# Patient Record
Sex: Male | Born: 1960 | Race: Black or African American | Hispanic: No | Marital: Married | Smoking: Never smoker
Health system: Southern US, Community
[De-identification: ages and names within clinical notes are randomized; demographics above are authoritative.]

---

## 1999-09-17 ENCOUNTER — Emergency Department (HOSPITAL_COMMUNITY): Admission: EM | Admit: 1999-09-17 | Discharge: 1999-09-17 | Payer: Self-pay | Admitting: Unknown Physician Specialty

## 2003-07-22 ENCOUNTER — Emergency Department (HOSPITAL_COMMUNITY): Admission: EM | Admit: 2003-07-22 | Discharge: 2003-07-22 | Payer: Self-pay | Admitting: Emergency Medicine

## 2008-11-18 ENCOUNTER — Encounter: Admission: RE | Admit: 2008-11-18 | Discharge: 2008-11-18 | Payer: Self-pay | Admitting: Family Medicine

## 2011-11-04 ENCOUNTER — Ambulatory Visit (INDEPENDENT_AMBULATORY_CARE_PROVIDER_SITE_OTHER): Payer: BC Managed Care – PPO | Admitting: Physician Assistant

## 2011-11-04 VITALS — BP 140/82 | HR 81 | Temp 98.6°F | Resp 18 | Ht 66.0 in | Wt 172.0 lb

## 2011-11-04 DIAGNOSIS — Z0000 Encounter for general adult medical examination without abnormal findings: Secondary | ICD-10-CM

## 2011-11-04 LAB — POCT URINALYSIS DIPSTICK
Blood, UA: NEGATIVE
Spec Grav, UA: 1.01

## 2011-11-04 NOTE — Progress Notes (Signed)
   [address], [place] [address]   Phone [phone]  Subjective:    Patient ID: Eric Norman, male    DOB: 05/13/1960, 51 y.o.   MRN: [id number]  HPI Pt presents to clinic for CPE/DOT exam.  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  He does not want labs.  He is healthy with no concerns.  He has never had BP problems before.   Review of Systems  Constitutional: Negative.   HENT: Negative.   Eyes: Negative.   Respiratory: Negative.   Cardiovascular: Negative.   Gastrointestinal: Negative.   Genitourinary: Negative.   Musculoskeletal: Negative.   Skin: Negative.   Neurological: Negative.   Hematological: Negative.   Psychiatric/Behavioral: Negative.        Objective:   Physical Exam  Vitals reviewed. Constitutional: He is oriented to person, place, and time. He appears well-developed and well-nourished.  HENT:  Head: Normocephalic and atraumatic.  Right Ear: Hearing, tympanic membrane, external ear and ear canal normal.  Left Ear: Hearing, tympanic membrane, external ear and ear canal normal.  Nose: Nose normal.  Mouth/Throat: Uvula is midline, oropharynx is clear and moist and mucous membranes are normal.  Eyes: Conjunctivae normal, EOM and lids are normal. Pupils are equal, round, and reactive to light.  Neck: Normal range of motion. Neck supple.  Cardiovascular: Normal rate, regular rhythm and normal heart sounds.  Exam reveals no gallop and no friction rub.   No murmur heard. Pulmonary/Chest: Effort normal and breath sounds normal.  Abdominal: Soft. Bowel sounds are normal. Hernia confirmed negative in the right inguinal area and confirmed negative in the left inguinal area.  Genitourinary: Penis normal.       Declined rectal exam.  Musculoskeletal: Normal range of motion.  Lymphadenopathy:    He has no cervical adenopathy.  Neurological: He is alert and oriented to person, place, and time.  Skin: Skin is warm and dry.  Psychiatric: He has a  normal mood and affect. His behavior is normal. Judgment and thought content normal.    Results for orders placed in visit on 11/04/11  POCT URINALYSIS DIPSTICK      Component Value Range   Color, UA yellow     Clarity, UA       Glucose, UA neg     Bilirubin, UA       Ketones, UA       Spec Grav, UA 1.010     Blood, UA neg     pH, UA       Protein, UA neg     Urobilinogen, UA       Nitrite, UA       Leukocytes, UA             Assessment & Plan:   1. Annual physical exam  POCT urinalysis dipstick   DOT paperwork filled out.  See media section for copy.  Pt will be RTC for me to fill in his BP on his physical form because I did not do it while he was here before he left.

## 2012-08-10 ENCOUNTER — Ambulatory Visit (INDEPENDENT_AMBULATORY_CARE_PROVIDER_SITE_OTHER): Payer: 59 | Admitting: Internal Medicine

## 2012-08-10 ENCOUNTER — Ambulatory Visit: Payer: 59

## 2012-08-10 VITALS — BP 112/74 | HR 73 | Temp 97.8°F | Resp 18 | Ht 65.0 in | Wt 175.0 lb

## 2012-08-10 DIAGNOSIS — M25521 Pain in right elbow: Secondary | ICD-10-CM

## 2012-08-10 DIAGNOSIS — M25529 Pain in unspecified elbow: Secondary | ICD-10-CM

## 2012-08-10 DIAGNOSIS — S46311A Strain of muscle, fascia and tendon of triceps, right arm, initial encounter: Secondary | ICD-10-CM

## 2012-08-10 DIAGNOSIS — S43499A Other sprain of unspecified shoulder joint, initial encounter: Secondary | ICD-10-CM

## 2012-08-10 NOTE — Progress Notes (Signed)
  Subjective:    Patient ID: Eric Norman, male    DOB: 1960/01/22, 52 y.o.   MRN: [id number]  HPI At home injured right elbow lifting and pulling. Sudden injury , has not improved in 3 weeks. Has weak tricep extention and pain distal posterior humerus.   Review of Systems     Objective:   Physical Exam  Vitals reviewed. Constitutional: He is oriented to person, place, and time. He appears well-developed and well-nourished. No distress.  HENT:  Nose: Nose normal.  Eyes: EOM are normal.  Cardiovascular: Normal rate.   Musculoskeletal: He exhibits edema and tenderness.       Right elbow: He exhibits decreased range of motion, swelling and deformity. He exhibits no laceration. Tenderness found. Olecranon process tenderness noted. No radial head, no medial epicondyle and no lateral epicondyle tenderness noted.  Neurological: He is alert and oriented to person, place, and time. He displays no atrophy. No sensory deficit. He exhibits abnormal muscle tone. Coordination normal.  Skin: No rash noted.  Psychiatric: He has a normal mood and affect.    [facility] reading (PRIMARY) by  Dr Raelene Bott fx seen        Assessment & Plan:  Right elbow strain/Possible torn tricep 91 weeks old Refer to ortho/[facility]

## 2012-08-10 NOTE — Patient Instructions (Addendum)
Triceps Tendon Rupture with Rehab The triceps muscle is located on the backside of the upper arm and is responsible for straightening the elbow and extending the upper arm backwards. A triceps tendon rupture is a complete tear of the tendon that attaches the triceps muscle to the ulna (one of the forearm bones). A triceps tendon rupture results in decreased triceps function. SYMPTOMS   Pain, tenderness, inflammation and/or bruising over the injury (contusion).  "Popping" or tearing sensation felt and/or heard in the elbow at the time of injury.  Decreased ability to straighten the elbow or extend the shoulder.  A crackling sound (crepitation) when the tendon is moved or touched.  Loss of firm fullness when pushing on the area where the tendon ruptured. CAUSES  Triceps tendon ruptures occur when a force is placed on the tendon that is greater than it can withstand. Common mechanisms of injury include:  Stress on the tendon from a sudden increase in intensity, frequency or duration of training.  Direct trauma to the tendon.  A cut (laceration) of the tendon. RISK INCREASES WITH:  Activities that involve repetitive movements and/or straightening of the elbow or extension of the shoulder (weightlifting or push-ups).  Poor strength and flexibility.  The use of steroids.  Previous use of corticosteroid injections.  Incomplete treatment of triceps tendinitis.  Previous triceps tendon injury. PREVENTION  Warm up and stretch properly before activity.  Allow for adequate recovery between workouts.  Maintain physical fitness:  Strength, flexibility, and endurance.  Cardiovascular fitness.  Learn and use proper technique, especially regarding training program design. When possible, have coach correct improper technique. PROGNOSIS  If treated properly, the symptoms of a ruptured triceps tendon usually resolve within 6 to 9 months, after which return to sports is allowed. RELATED  COMPLICATIONS   Permanent elbow weakness.  Re-rupture of the tendon after treatment.  Prolonged disability.  Risks of surgery: infection, bleeding, nerve damage or damage to surrounding tissues. TREATMENT Treatment initially involves resting from any activities that aggravate the symptoms, and the use of ice and medications to help reduce pain and inflammation. A triceps tendon rupture will not heal if left untreated. Definitive treatment requires surgery to reattach the tendon. After surgery the elbow and shoulder must be immobilized to allow for healing. After immobilization it is important to perform strengthening and stretching exercises to help regain strength and a full range of motion. These exercises may be completed at home or with a therapist. MEDICATION  If pain medication is necessary, then nonsteroidal anti-inflammatory medications, such as aspirin and ibuprofen, or other minor pain relievers, such as acetaminophen, are often recommended.  Do not take pain medication within 7 days before surgery.  Prescription pain relievers may be given if deemed necessary by your caregiver. Use only as directed and only as much as you need. COLD THERAPY  Cold treatment (icing) relieves pain and reduces inflammation. Cold treatment should be applied for 10 to 15 minutes every 2 to 3 hours for inflammation and pain and immediately after any activity that aggravates your symptoms. Use ice packs or massage the area with a piece of ice (ice massage). SEEK MEDICAL CARE IF:  Treatment seems to offer no benefit, or the condition worsens.  Any medications produce adverse side effects.  Any complications from surgery occur including:  Pain, numbness, or coldness in the extremity operated upon.  Discoloration of the nail beds (they become blue or gray) of the extremity operated upon.  Signs of infections (fever, pain, inflammation,  redness, or persistent bleeding). EXERCISES RANGE OF MOTION  (ROM) AND STRETCHING EXERCISES - Triceps Tendon Rupture These exercises may help you when beginning to rehabilitate your injury. Your symptoms may resolve with or without further involvement from your physician, physical therapist or athletic trainer. While completing these exercises, remember:  Restoring tissue flexibility helps normal motion to return to the joints. This allows healthier, less painful movement and activity.  An effective stretch should be held for at least 30 seconds.  A stretch should never be painful. You should only feel a gentle lengthening or release in the stretched tissue. RANGE OF MOTION  Flexion  Hold your right / left arm at your side and bend your elbow as far as you can using your right / left arm muscles.  Bend the right / left elbow farther by gently pushing up on your forearm, until you feel a gentle stretch on the outside of your elbow. Hold this position for __________ seconds.  Slowly return to the starting position. Repeat __________ times. Complete this exercise __________ times per day. RANGE OF MOTION  Elbow Flexion, Supine  Lie on your back. Extend your right / left arm into the air, bracing it with your opposite hand. Allow your right / left arm to relax.  Let your elbow bend, allowing your hand to fall slowly toward your chest.  You should feel a gentle stretch along the back of your upper arm and/or elbow. Your physician, physical therapist or athletic trainer may ask you to hold a __________ hand weight to increase the intensity of this stretch.  Hold for __________ seconds. Slowly return your right / left arm to the upright position. Repeat __________ times. Complete this exercise __________ times per day. STRETCH  Elbow Flexors   Lie on a firm bed or countertop on your back. Be sure that you are in a comfortable position which will allow you to relax your arm muscles.  Place a folded towel under your upper arm so that your elbow and  shoulder are at the same height. Extend your arm; your elbow should not rest on the bed or towel.  Allow the weight of your hand to straighten your elbow. Keep your arm and chest muscles relaxed. Your caretaker may ask you to increase the intensity of your stretch by adding a small wrist or hand weight.  Hold for __________ seconds. You should feel a stretch on the inside of your elbow. Slowly return to the starting position. Repeat __________ times. Complete this exercise __________ times per day. STRENGTHENING EXERCISES - Triceps Tendon Rupture These exercises will help you regain your strength. These exercises may resolve your symptoms with or without further involvement from your physician, physical therapist or athletic trainer. While completing these exercises, remember:  Muscles can gain both the endurance and the strength needed for everyday activities through controlled exercises.  Complete these exercises as instructed by your physician, physical therapist or athletic trainer. Progress with the resistance and repetition exercises only as your caregiver advises.  You may experience muscle soreness or fatigue, but the pain or discomfort you are trying to eliminate should never worsen during these exercises. If this pain does worsen, stop and make certain you are following the directions exactly. If the pain is still present after adjustments, stop the exercise until you can discuss the trouble with your clinician. STRENGTH - Elbow Extensors, Isometric  Stand or sit upright on a firm surface. Place your right / left arm so that your palm faces  your abdomen and it is at the height of your waist.  Place your opposite hand on the underside of your forearm. Gently push up as your right / left arm resists. Push as hard as you can with both arms without causing any pain or movement at your right / left elbow. Hold this stationary position for __________ seconds.  Gradually release the tension in  both arms. Allow your muscles to relax completely before repeating. Repeat __________ times. Complete this exercise __________ times per day. STRENGTH  Elbow Flexors, Supinated  With good posture, stand or sit on a firm chair without armrests. Allow your right / left arm to rest at your side with your palm facing forward.  Holding a __________ weight or gripping a rubber exercise band/tubing, bring your hand toward your shoulder.  Allow your muscles to control the resistance as your hand returns to your side. Repeat __________ times. Complete this exercise __________ times per day.  STRENGTH  Elbow Flexors, Neutral  With good posture, stand or sit on a firm chair without armrests. Allow your right / left arm to rest at your side with your thumb facing forward.  Holding a __________ weight or gripping a rubber exercise band/tubing, bring your hand toward your shoulder.  Allow your muscles to control the resistance as your hand returns to your side. Repeat __________ times. Complete this exercise __________ times per day. STRENGTH  Elbow Extensors  Lie on your back. Extend your right / left elbow into the air, pointing it toward the ceiling. Brace your arm with your opposite hand.*  Holding a __________ weight in your hand, slowly straighten your right / left elbow.  Allow your muscles to control the weight as your hand returns to its starting position. Repeat __________ times. Complete this exercise __________ times per day. *You may also stand with your elbow overhead and pointed toward the ceiling and supported by your opposite hand. STRENGTH - Elbow Extensors, Dynamic  With good posture, stand or sit on a firm chair without armrests. Keeping your upper arms at your side, bring both hands up to your right / left shoulder while gripping a rubber exercise band/tubing. Your right / left hand should be just below the other hand.  Straighten your right / left elbow. Hold for __________  seconds.  Allow your muscles to control the rubber exercise band/tubing as your hand returns to your shoulder. Repeat __________ times. Complete this exercise __________ times per day. Document Released: 12/27/2004 Document Revised: 03/21/2011 Document Reviewed: 04/10/2008 [facility] Patient Information 2014 [place], [place].

## 2012-08-15 ENCOUNTER — Telehealth: Payer: Self-pay

## 2012-08-15 NOTE — Telephone Encounter (Signed)
Error

## 2014-10-14 ENCOUNTER — Telehealth: Payer: Self-pay

## 2014-10-14 NOTE — Telephone Encounter (Signed)
Patient left a voicemail today at 12:22pm stating he no longer needs records since he initially needed them this morning and didn't receive them. Medical records can disregard request.

## 2014-10-14 NOTE — Telephone Encounter (Signed)
Patient left VM requesting a call back regarding his 2014 OV.  No detailed information left on VM.  [phone] Called patient and asked the patient to call back.

## 2016-05-16 DIAGNOSIS — I10 Essential (primary) hypertension: Secondary | ICD-10-CM | POA: Diagnosis not present

## 2016-05-30 DIAGNOSIS — E782 Mixed hyperlipidemia: Secondary | ICD-10-CM | POA: Diagnosis not present

## 2016-05-30 DIAGNOSIS — I10 Essential (primary) hypertension: Secondary | ICD-10-CM | POA: Diagnosis not present

## 2016-06-30 DIAGNOSIS — N529 Male erectile dysfunction, unspecified: Secondary | ICD-10-CM | POA: Diagnosis not present

## 2016-06-30 DIAGNOSIS — I10 Essential (primary) hypertension: Secondary | ICD-10-CM | POA: Diagnosis not present

## 2016-07-09 ENCOUNTER — Encounter (HOSPITAL_COMMUNITY): Payer: Self-pay | Admitting: Emergency Medicine

## 2016-07-09 ENCOUNTER — Emergency Department (HOSPITAL_COMMUNITY): Payer: BLUE CROSS/BLUE SHIELD

## 2016-07-09 ENCOUNTER — Emergency Department (HOSPITAL_COMMUNITY)
Admission: EM | Admit: 2016-07-09 | Discharge: 2016-07-09 | Disposition: A | Discharge status: Home / Self Care | Payer: BLUE CROSS/BLUE SHIELD | Attending: Emergency Medicine | Admitting: Emergency Medicine

## 2016-07-09 DIAGNOSIS — R1110 Vomiting, unspecified: Secondary | ICD-10-CM | POA: Diagnosis not present

## 2016-07-09 DIAGNOSIS — R112 Nausea with vomiting, unspecified: Secondary | ICD-10-CM | POA: Diagnosis not present

## 2016-07-09 DIAGNOSIS — Z79899 Other long term (current) drug therapy: Secondary | ICD-10-CM | POA: Insufficient documentation

## 2016-07-09 DIAGNOSIS — R1084 Generalized abdominal pain: Secondary | ICD-10-CM | POA: Diagnosis present

## 2016-07-09 DIAGNOSIS — R197 Diarrhea, unspecified: Secondary | ICD-10-CM | POA: Diagnosis not present

## 2016-07-09 DIAGNOSIS — R1013 Epigastric pain: Secondary | ICD-10-CM | POA: Insufficient documentation

## 2016-07-09 DIAGNOSIS — R109 Unspecified abdominal pain: Secondary | ICD-10-CM | POA: Diagnosis not present

## 2016-07-09 LAB — URINALYSIS, ROUTINE W REFLEX MICROSCOPIC
BILIRUBIN URINE: NEGATIVE
GLUCOSE, UA: NEGATIVE mg/dL
HGB URINE DIPSTICK: NEGATIVE
KETONES UR: NEGATIVE mg/dL
Leukocytes, UA: NEGATIVE
Nitrite: NEGATIVE
PROTEIN: NEGATIVE mg/dL
Specific Gravity, Urine: 1.025 (ref 1.005–1.030)
pH: 5 (ref 5.0–8.0)

## 2016-07-09 LAB — COMPREHENSIVE METABOLIC PANEL
ALK PHOS: 75 U/L (ref 38–126)
ALT: 33 U/L (ref 17–63)
AST: 28 U/L (ref 15–41)
Albumin: 3.6 g/dL (ref 3.5–5.0)
Anion gap: 10 (ref 5–15)
BUN: 13 mg/dL (ref 6–20)
CALCIUM: 8.8 mg/dL — AB (ref 8.9–10.3)
CHLORIDE: 102 mmol/L (ref 101–111)
CO2: 25 mmol/L (ref 22–32)
CREATININE: 1.4 mg/dL — AB (ref 0.61–1.24)
GFR calc non Af Amer: 56 mL/min — ABNORMAL LOW (ref >60)
Glucose, Bld: 119 mg/dL — ABNORMAL HIGH (ref 65–99)
Potassium: 3.6 mmol/L (ref 3.5–5.1)
SODIUM: 137 mmol/L (ref 135–145)
Total Bilirubin: 0.6 mg/dL (ref 0.3–1.2)
Total Protein: 7 g/dL (ref 6.5–8.1)

## 2016-07-09 LAB — CBC
HCT: 38.6 % — ABNORMAL LOW (ref 39.0–52.0)
Hemoglobin: 13.5 g/dL (ref 13.0–17.0)
MCH: 30.5 pg (ref 26.0–34.0)
MCHC: 35 g/dL (ref 30.0–36.0)
MCV: 87.1 fL (ref 78.0–100.0)
PLATELETS: 351 10*3/uL (ref 150–400)
RBC: 4.43 MIL/uL (ref 4.22–5.81)
RDW: 14 % (ref 11.5–15.5)
WBC: 8.6 10*3/uL (ref 4.0–10.5)

## 2016-07-09 LAB — LIPASE, BLOOD: LIPASE: 41 U/L (ref 11–51)

## 2016-07-09 LAB — I-STAT CG4 LACTIC ACID, ED: LACTIC ACID, VENOUS: 1.22 mmol/L (ref 0.5–1.9)

## 2016-07-09 MED ORDER — IOPAMIDOL (ISOVUE-300) INJECTION 61%
INTRAVENOUS | Status: AC
  Administered 2016-07-09: 100 mL
  Filled 2016-07-09: qty 100

## 2016-07-09 MED ORDER — SODIUM CHLORIDE 0.9 % IV BOLUS (SEPSIS)
1000.0000 mL | Freq: Once | INTRAVENOUS | Status: AC
  Administered 2016-07-09: 1000 mL via INTRAVENOUS

## 2016-07-09 MED ORDER — FENTANYL CITRATE (PF) 100 MCG/2ML IJ SOLN: 50.0000 ug | INTRAMUSCULAR | Status: DC | PRN

## 2016-07-09 MED ORDER — GI COCKTAIL ~~LOC~~
30.0000 mL | Freq: Once | ORAL | Status: AC
  Administered 2016-07-09: 30 mL via ORAL
  Filled 2016-07-09: qty 30

## 2016-07-09 MED ORDER — OMEPRAZOLE 20 MG PO CPDR: 20.0000 mg | DELAYED_RELEASE_CAPSULE | Freq: Two times a day (BID) | ORAL | 0 refills | Status: AC

## 2016-07-09 MED ORDER — FAMOTIDINE IN NACL 20-0.9 MG/50ML-% IV SOLN
20.0000 mg | Freq: Once | INTRAVENOUS | Status: AC
  Administered 2016-07-09: 20 mg via INTRAVENOUS
  Filled 2016-07-09: qty 50

## 2016-07-09 NOTE — Discharge Instructions (Signed)
Take acid medication as directed. Follow up with primary and GI doctors.  If you were given medicines take as directed.  If you are on coumadin or contraceptives realize their levels and effectiveness is altered by many different medicines.  If you have any reaction (rash, tongues swelling, other) to the medicines stop taking and see a physician.    If your blood pressure was elevated in the ER make sure you follow up for management with a primary doctor or return for chest pain, shortness of breath or stroke symptoms.  Please follow up as directed and return to the ER or see a physician for new or worsening symptoms.  Thank you. Vitals:   07/09/16 2045 07/09/16 2100 07/09/16 2305 07/09/16 2315  BP: 138/90 (!) 146/76 (!) 145/92 (!) 150/91  Pulse: 69 69 67 71  Resp:      Temp:      TempSrc:      SpO2: 98% 98% 99% 99%  Weight:      Height:

## 2016-07-09 NOTE — ED Provider Notes (Signed)
[facility]-EMERGENCY DEPT Provider Note   CSN: [id number] Arrival date & time: 07/09/16  1905     History   Chief Complaint Chief Complaint  Patient presents with  . Abdominal Pain  . Nausea    HPI TRESHUN WOLD is a 56 y.o. male.  Patient presents with recurrent epigastric pain vomiting and diarrhea for the past week. Initially he felt it was secondary to eating [place] food. However pain has persisted since. Patient had EGD performed years ago. Patient's chronic alcohol user 3 drinks daily. No blood in stools. No diagnosed ulcer in the past. Pain worse after eating.      History reviewed. No pertinent past medical history.  There are no active problems to display for this patient.   History reviewed. No pertinent surgical history.     Home Medications    Prior to Admission medications   Medication Sig Start Date End Date Taking? Authorizing Provider  amLODipine (NORVASC) 5 MG tablet Take 5 mg by mouth daily. 06/14/16  Yes [provider]  valsartan-hydrochlorothiazide (DIOVAN-HCT) 160-12.5 MG tablet Take 1 tablet by mouth daily. 06/14/16  Yes [provider]  omeprazole (PRILOSEC) 20 MG capsule Take 1 capsule (20 mg total) by mouth 2 (two) times daily before a meal. 07/09/16   Blane Ohara, MD    Family History Family History  Problem Relation Age of Onset  . Cancer Brother   . Diabetes Maternal Grandmother     Social History Social History  Substance Use Topics  . Smoking status: Never Smoker  . Smokeless tobacco: Never Used  . Alcohol use Yes     Comment: 32 oz per day on the weekend     Allergies   Peanut-containing drug products   Review of Systems Review of Systems  Constitutional: Positive for unexpected weight change. Negative for chills and fever.  HENT: Negative for congestion.   Eyes: Negative for visual disturbance.  Respiratory: Negative for shortness of breath.   Cardiovascular: Negative for chest pain.    Gastrointestinal: Positive for abdominal pain, diarrhea, nausea and vomiting.  Genitourinary: Negative for dysuria and flank pain.  Musculoskeletal: Negative for back pain, neck pain and neck stiffness.  Skin: Negative for rash.  Neurological: Negative for light-headedness and headaches.     Physical Exam Updated Vital Signs BP (!) 142/93 (BP Location: Left Arm)   Pulse (!) 59   Temp 98.3 F (36.8 C) (Oral)   Resp 16   Ht 5\' 6"  (1.676 m)   Wt 79.4 kg (175 lb)   SpO2 98%   BMI 28.25 kg/m   Physical Exam  Constitutional: He is oriented to person, place, and time. He appears well-developed and well-nourished.  HENT:  Head: Normocephalic and atraumatic.  Eyes: Conjunctivae are normal. Right eye exhibits no discharge. Left eye exhibits no discharge.  Neck: Normal range of motion. Neck supple. No tracheal deviation present.  Cardiovascular: Normal rate and regular rhythm.   Pulmonary/Chest: Effort normal and breath sounds normal.  Abdominal: Soft. He exhibits no distension. There is tenderness (epigastric). There is no guarding.  Musculoskeletal: He exhibits no edema.  Neurological: He is alert and oriented to person, place, and time.  Skin: Skin is warm. No rash noted.  Psychiatric: He has a normal mood and affect.  Nursing note and vitals reviewed.    ED Treatments / Results  Labs (all labs ordered are listed, but only abnormal results are displayed) Labs Reviewed  COMPREHENSIVE METABOLIC PANEL - Abnormal; Notable for the following:  Result Value   Glucose, Bld 119 (*)    Creatinine, Ser 1.40 (*)    Calcium 8.8 (*)    GFR calc non Af Amer 56 (*)    All other components within normal limits  CBC - Abnormal; Notable for the following:    HCT 38.6 (*)    All other components within normal limits  LIPASE, BLOOD  URINALYSIS, ROUTINE W REFLEX MICROSCOPIC  I-STAT CG4 LACTIC ACID, ED    EKG  EKG Interpretation None       Radiology Ct Angio Abd/pel W And/or  Wo Contrast  Result Date: 07/09/2016 CLINICAL DATA:  56 y/o M; recurrent abdominal pain after eating with nausea, vomiting, diarrhea. EXAM: CTA ABDOMEN AND PELVIS WITH CONTRAST TECHNIQUE: Multidetector CT imaging of the abdomen and pelvis was performed using the standard protocol during bolus administration of intravenous contrast. Multiplanar reconstructed images and MIPs were obtained and reviewed to evaluate the vascular anatomy. CONTRAST:  100mL ISOVUE-300 IOPAMIDOL (ISOVUE-300) INJECTION 61% COMPARISON:  None. FINDINGS: VASCULAR Aorta: Normal caliber aorta without aneurysm, dissection, vasculitis or significant stenosis. Celiac: Patent without evidence of aneurysm, dissection, vasculitis or significant stenosis. SMA: Patent without evidence of aneurysm, dissection, vasculitis or significant stenosis. Renals: Both renal arteries are patent without evidence of aneurysm, dissection, vasculitis, fibromuscular dysplasia or significant stenosis. IMA: Patent without evidence of aneurysm, dissection, vasculitis or significant stenosis. Inflow: Patent without evidence of aneurysm, dissection, vasculitis or significant stenosis. Proximal Outflow: Bilateral common femoral and visualized portions of the superficial and profunda femoral arteries are patent without evidence of aneurysm, dissection, vasculitis or significant stenosis. Veins: No central systemic or portal venous thrombosis identified. Review of the MIP images confirms the above findings. NON-VASCULAR Lower chest: No acute abnormality. Hepatobiliary: No focal liver abnormality is seen. No gallstones, gallbladder wall thickening, or biliary dilatation. Pancreas: Unremarkable. No pancreatic ductal dilatation or surrounding inflammatory changes. Spleen: 7 mm focus within right pole of spleen with enhancement following blood pool compatible with hemangioma. Adrenals/Urinary Tract: Adrenal glands are unremarkable. Kidneys are normal, without renal calculi, focal  lesion, or hydronephrosis. Bladder is unremarkable. Stomach/Bowel: Stomach is within normal limits. Appendix appears normal. No evidence of bowel wall thickening, distention, or inflammatory changes. Lymphatic: No significant vascular findings are present. No enlarged abdominal or pelvic lymph nodes. Reproductive: Prostate is unremarkable. Other: No abdominal wall hernia or abnormality. No abdominopelvic ascites. Musculoskeletal: No acute or significant osseous findings. IMPRESSION: VASCULAR Negative CTA of abdomen and pelvis. No evidence of aneurysm, dissection, vasculitis, stenosis, or occlusion of the aorta or its major branches. No central systemic or portal venous thrombosis identified. NON-VASCULAR 1. Subcentimeter hemangioma within the spleen. 2. Otherwise unremarkable CTA of the abdomen and pelvis. Electronically Signed   By: Mitzi HansenLance  Furusawa-Stratton M.D.   On: 07/09/2016 23:17    Procedures Procedures (including critical care time)  Medications Ordered in ED Medications  fentaNYL (SUBLIMAZE) injection 50 mcg (not administered)  sodium chloride 0.9 % bolus 1,000 mL (0 mLs Intravenous Stopped 07/09/16 2304)  famotidine (PEPCID) IVPB 20 mg premix (0 mg Intravenous Stopped 07/09/16 2231)  gi cocktail (Maalox,Lidocaine,Donnatal) (30 mLs Oral Given 07/09/16 2200)  iopamidol (ISOVUE-300) 61 % injection (100 mLs  Contrast Given 07/09/16 2225)     Initial Impression / Assessment and Plan / ED Course  I have reviewed the triage vital signs and the nursing notes.  Pertinent labs & imaging results that were available during my care of the patient were reviewed by me and considered in my medical decision making (see  chart for details).    Patient presents with recurrent and worsening epigastric discomfort. Concern clinically for gastroenteritis versus ulcer versus mesenteric ischemia with weight loss and worsening pain after eating. CT angiogram no acute findings. Discussed outpatient follow-up.  Patient improved with supportive care in the ER.  Results and differential diagnosis were discussed with the patient/parent/guardian. Xrays were independently reviewed by myself.  Close follow up outpatient was discussed, comfortable with the plan.   Medications  fentaNYL (SUBLIMAZE) injection 50 mcg (not administered)  sodium chloride 0.9 % bolus 1,000 mL (0 mLs Intravenous Stopped 07/09/16 2304)  famotidine (PEPCID) IVPB 20 mg premix (0 mg Intravenous Stopped 07/09/16 2231)  gi cocktail (Maalox,Lidocaine,Donnatal) (30 mLs Oral Given 07/09/16 2200)  iopamidol (ISOVUE-300) 61 % injection (100 mLs  Contrast Given 07/09/16 2225)    Vitals:   07/09/16 2305 07/09/16 2315 07/09/16 2330 07/09/16 2340  BP: (!) 145/92 (!) 150/91 (!) 142/93 (!) 142/93  Pulse: 67 71 60 (!) 59  Resp:    16  Temp:      TempSrc:      SpO2: 99% 99% 99% 98%  Weight:      Height:        Final diagnoses:  Epigastric pain     Final Clinical Impressions(s) / ED Diagnoses   Final diagnoses:  Epigastric pain    New Prescriptions New Prescriptions   OMEPRAZOLE (PRILOSEC) 20 MG CAPSULE    Take 1 capsule (20 mg total) by mouth 2 (two) times daily before a meal.     Blane Ohara, MD 07/09/16 2349

## 2016-07-09 NOTE — ED Notes (Signed)
Nurse starting IV and will draw labs. 

## 2016-07-09 NOTE — ED Triage Notes (Signed)
Reports eating bad chinese a week ago.  Has been having n/v/d and abdominal cramping since.  Reports pain is constant but worse with eating.

## 2016-08-11 DIAGNOSIS — I10 Essential (primary) hypertension: Secondary | ICD-10-CM | POA: Diagnosis not present

## 2016-11-03 DIAGNOSIS — M25562 Pain in left knee: Secondary | ICD-10-CM | POA: Diagnosis not present

## 2016-11-10 DIAGNOSIS — R739 Hyperglycemia, unspecified: Secondary | ICD-10-CM | POA: Diagnosis not present

## 2016-11-10 DIAGNOSIS — N529 Male erectile dysfunction, unspecified: Secondary | ICD-10-CM | POA: Diagnosis not present

## 2016-11-10 DIAGNOSIS — E782 Mixed hyperlipidemia: Secondary | ICD-10-CM | POA: Diagnosis not present

## 2016-11-10 DIAGNOSIS — M25562 Pain in left knee: Secondary | ICD-10-CM | POA: Diagnosis not present

## 2016-11-10 DIAGNOSIS — I10 Essential (primary) hypertension: Secondary | ICD-10-CM | POA: Diagnosis not present

## 2016-11-16 DIAGNOSIS — M25562 Pain in left knee: Secondary | ICD-10-CM | POA: Diagnosis not present

## 2016-11-22 DIAGNOSIS — M25562 Pain in left knee: Secondary | ICD-10-CM | POA: Diagnosis not present

## 2016-12-07 DIAGNOSIS — M25762 Osteophyte, left knee: Secondary | ICD-10-CM | POA: Diagnosis not present

## 2016-12-07 DIAGNOSIS — Y999 Unspecified external cause status: Secondary | ICD-10-CM | POA: Diagnosis not present

## 2016-12-07 DIAGNOSIS — G8918 Other acute postprocedural pain: Secondary | ICD-10-CM | POA: Diagnosis not present

## 2016-12-07 DIAGNOSIS — S86812A Strain of other muscle(s) and tendon(s) at lower leg level, left leg, initial encounter: Secondary | ICD-10-CM | POA: Diagnosis not present

## 2016-12-07 DIAGNOSIS — X58XXXA Exposure to other specified factors, initial encounter: Secondary | ICD-10-CM | POA: Diagnosis not present

## 2016-12-22 DIAGNOSIS — M25762 Osteophyte, left knee: Secondary | ICD-10-CM | POA: Diagnosis not present

## 2017-01-06 DIAGNOSIS — M25762 Osteophyte, left knee: Secondary | ICD-10-CM | POA: Diagnosis not present

## 2017-01-19 DIAGNOSIS — Z9889 Other specified postprocedural states: Secondary | ICD-10-CM | POA: Diagnosis not present

## 2017-03-02 DIAGNOSIS — Z9889 Other specified postprocedural states: Secondary | ICD-10-CM | POA: Diagnosis not present

## 2017-03-13 DIAGNOSIS — R739 Hyperglycemia, unspecified: Secondary | ICD-10-CM | POA: Diagnosis not present

## 2017-03-13 DIAGNOSIS — E782 Mixed hyperlipidemia: Secondary | ICD-10-CM | POA: Diagnosis not present

## 2017-03-13 DIAGNOSIS — N529 Male erectile dysfunction, unspecified: Secondary | ICD-10-CM | POA: Diagnosis not present

## 2017-03-13 DIAGNOSIS — I10 Essential (primary) hypertension: Secondary | ICD-10-CM | POA: Diagnosis not present

## 2017-06-15 DIAGNOSIS — R739 Hyperglycemia, unspecified: Secondary | ICD-10-CM | POA: Diagnosis not present

## 2017-06-15 DIAGNOSIS — E782 Mixed hyperlipidemia: Secondary | ICD-10-CM | POA: Diagnosis not present

## 2017-06-15 DIAGNOSIS — Z125 Encounter for screening for malignant neoplasm of prostate: Secondary | ICD-10-CM | POA: Diagnosis not present

## 2017-06-15 DIAGNOSIS — N529 Male erectile dysfunction, unspecified: Secondary | ICD-10-CM | POA: Diagnosis not present

## 2017-06-15 DIAGNOSIS — I10 Essential (primary) hypertension: Secondary | ICD-10-CM | POA: Diagnosis not present

## 2017-09-14 DIAGNOSIS — N529 Male erectile dysfunction, unspecified: Secondary | ICD-10-CM | POA: Diagnosis not present

## 2017-09-14 DIAGNOSIS — I10 Essential (primary) hypertension: Secondary | ICD-10-CM | POA: Diagnosis not present

## 2017-09-14 DIAGNOSIS — E782 Mixed hyperlipidemia: Secondary | ICD-10-CM | POA: Diagnosis not present

## 2017-09-14 DIAGNOSIS — E1100 Type 2 diabetes mellitus with hyperosmolarity without nonketotic hyperglycemic-hyperosmolar coma (NKHHC): Secondary | ICD-10-CM | POA: Diagnosis not present

## 2017-09-14 DIAGNOSIS — R739 Hyperglycemia, unspecified: Secondary | ICD-10-CM | POA: Diagnosis not present

## 2017-09-14 DIAGNOSIS — E785 Hyperlipidemia, unspecified: Secondary | ICD-10-CM | POA: Diagnosis not present

## 2017-12-26 DIAGNOSIS — I10 Essential (primary) hypertension: Secondary | ICD-10-CM | POA: Diagnosis not present

## 2017-12-26 DIAGNOSIS — E785 Hyperlipidemia, unspecified: Secondary | ICD-10-CM | POA: Diagnosis not present

## 2017-12-26 DIAGNOSIS — E782 Mixed hyperlipidemia: Secondary | ICD-10-CM | POA: Diagnosis not present

## 2017-12-26 DIAGNOSIS — E1100 Type 2 diabetes mellitus with hyperosmolarity without nonketotic hyperglycemic-hyperosmolar coma (NKHHC): Secondary | ICD-10-CM | POA: Diagnosis not present

## 2017-12-27 DIAGNOSIS — E1100 Type 2 diabetes mellitus with hyperosmolarity without nonketotic hyperglycemic-hyperosmolar coma (NKHHC): Secondary | ICD-10-CM | POA: Diagnosis not present

## 2017-12-27 DIAGNOSIS — I10 Essential (primary) hypertension: Secondary | ICD-10-CM | POA: Diagnosis not present

## 2017-12-27 DIAGNOSIS — E782 Mixed hyperlipidemia: Secondary | ICD-10-CM | POA: Diagnosis not present

## 2017-12-27 DIAGNOSIS — Z6829 Body mass index (BMI) 29.0-29.9, adult: Secondary | ICD-10-CM | POA: Diagnosis not present

## 2017-12-28 IMAGING — CT CT CTA ABD/PEL W/CM AND/OR W/O CM
2 of 9 series · 11 of 46 positions shown, 17 images · IV contrast (APPLIED)
Comparison: None.

CLINICAL DATA: 56 y/o M; recurrent abdominal pain after eating with
nausea, vomiting, diarrhea.

EXAM:
CTA ABDOMEN AND PELVIS WITH CONTRAST
TECHNIQUE: Multidetector CT imaging of the abdomen and pelvis was performed
using the standard protocol during bolus administration of
intravenous contrast. Multiplanar reconstructed images and MIPs were
obtained and reviewed to evaluate the vascular anatomy.
CONTRAST:  100mL BKG3XS-SSS IOPAMIDOL (BKG3XS-SSS) INJECTION 61%

[Series 7: coronals · coronal · 0.75mm/px · 2 of 120 slices shown]
[im 40/120  soft-tissue]
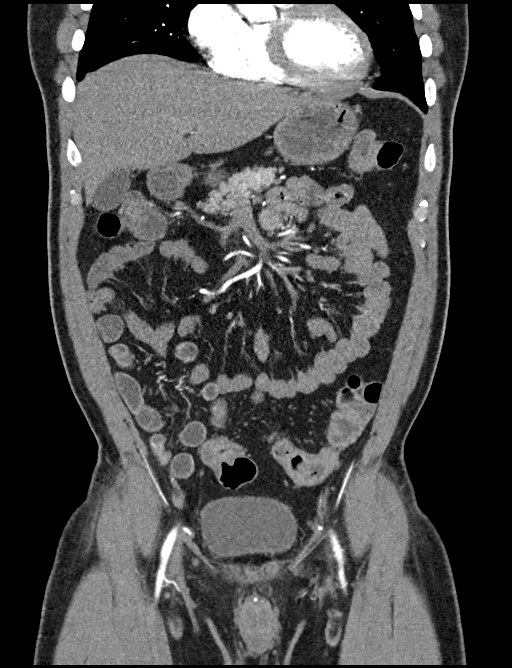
[im 80/120  soft-tissue]
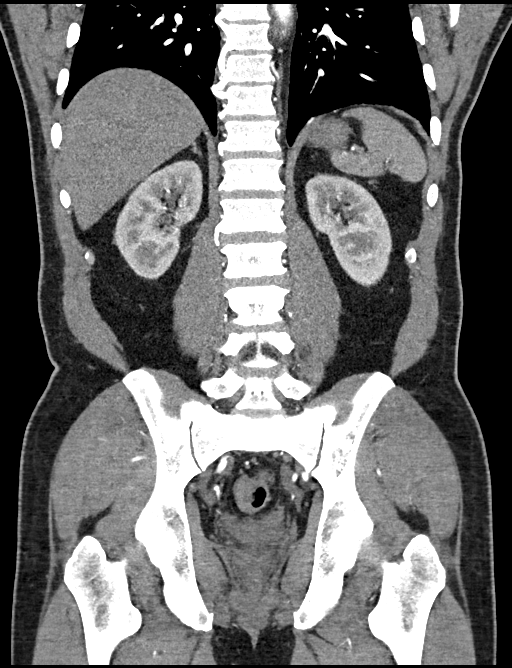

[Series 11: venous 5.0 i30f 1 · axial · portal-venous · 0.74mm/px · z∈[+973,+1373]mm · 9 of 100 slices shown, 15 images]
[im 10/100  soft-tissue]
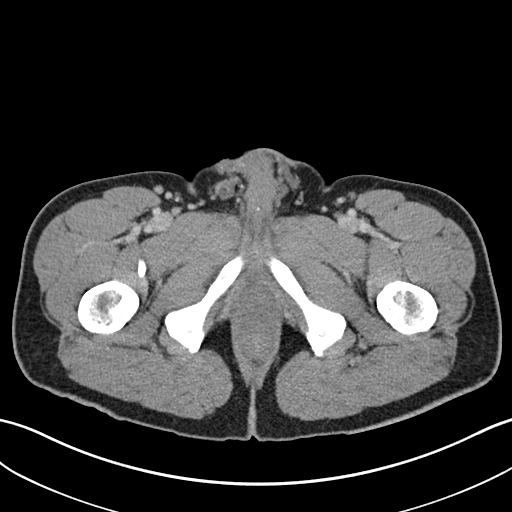
[im 10/100  bone]
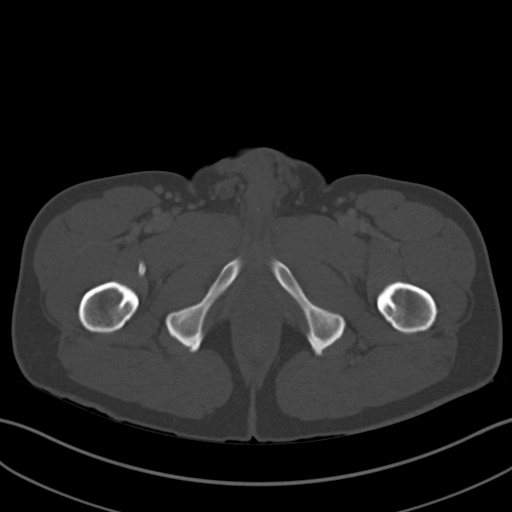
[im 20/100  soft-tissue]
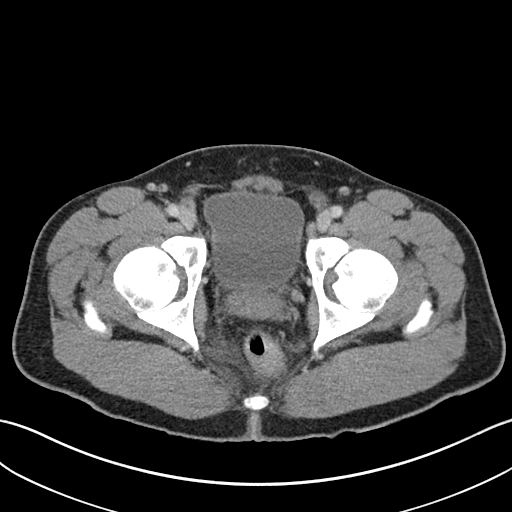
[im 30/100  soft-tissue]
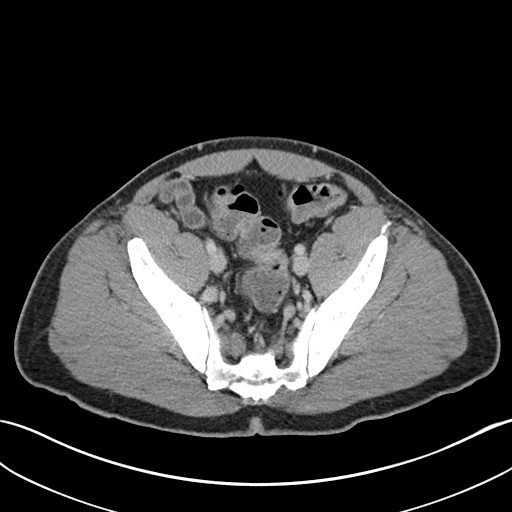
[im 40/100  soft-tissue]
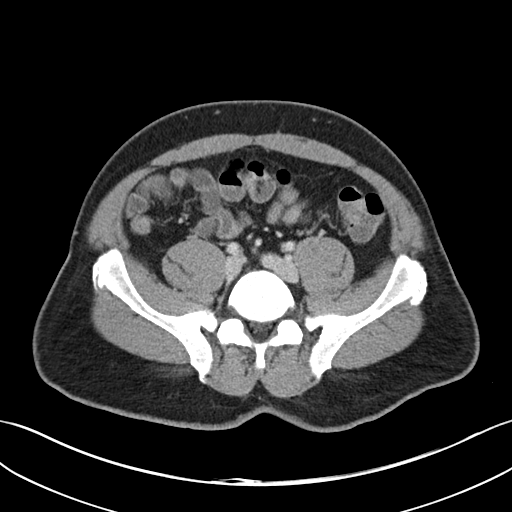
[im 50/100  soft-tissue]
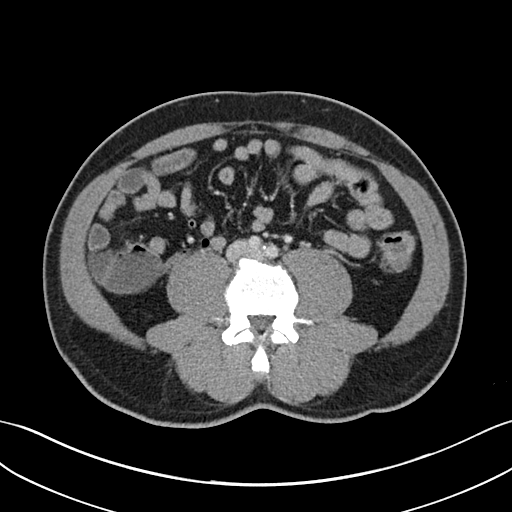
[im 60/100  soft-tissue]
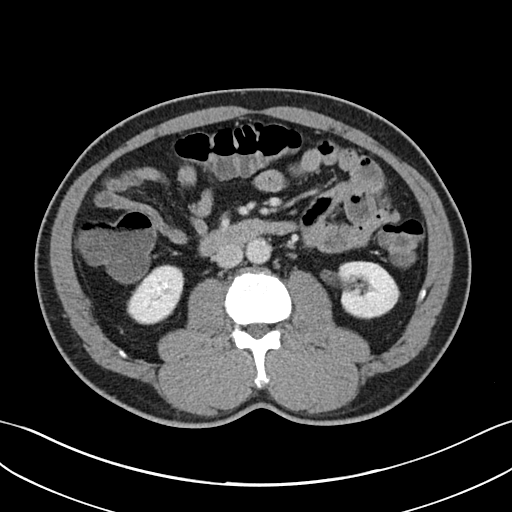
[im 60/100  lung]
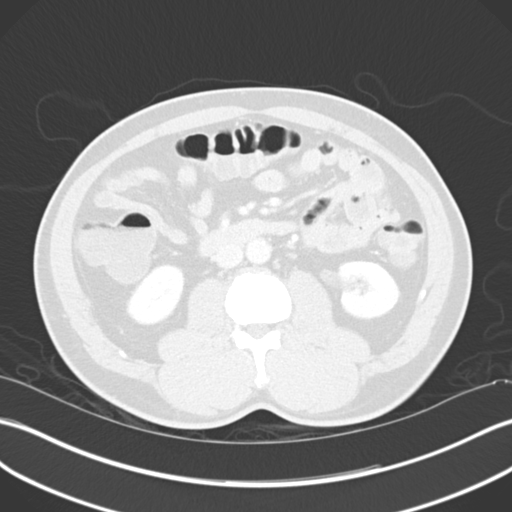
[im 70/100  soft-tissue]
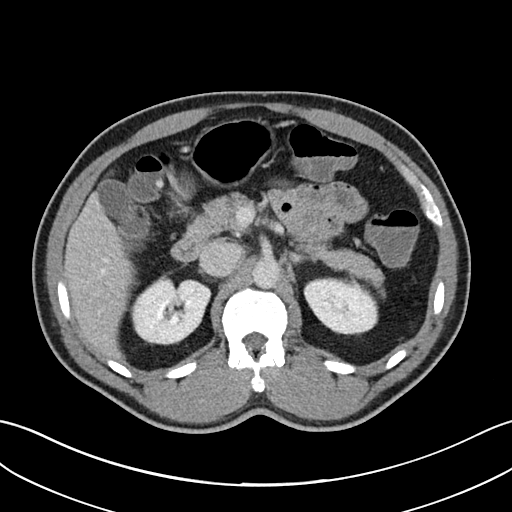
[im 70/100  lung]
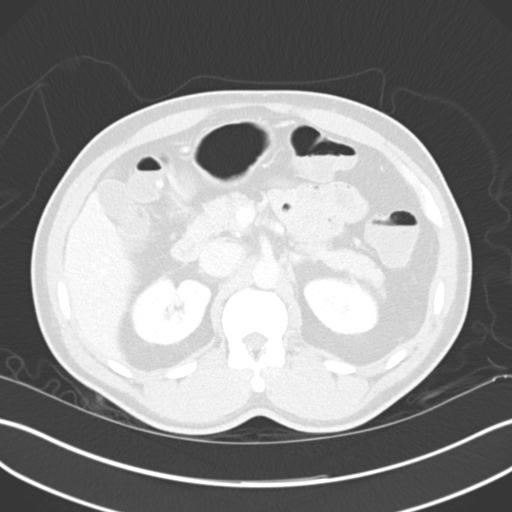
[im 80/100  soft-tissue]
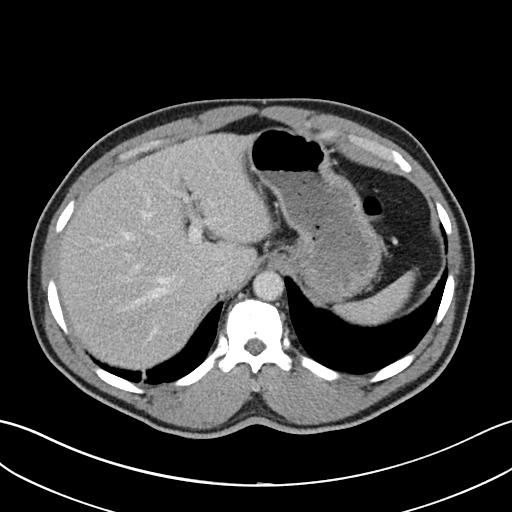
[im 80/100  lung]
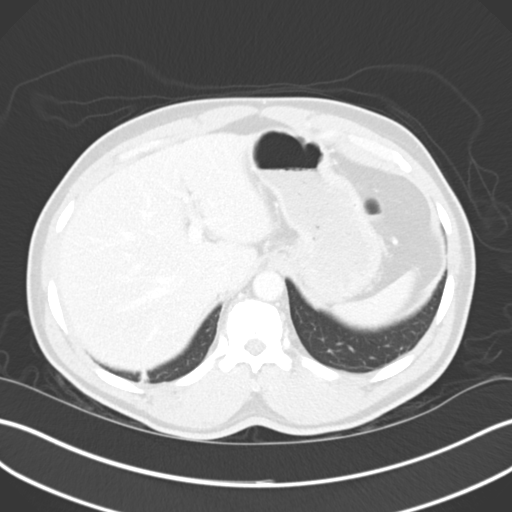
[im 90/100  soft-tissue]
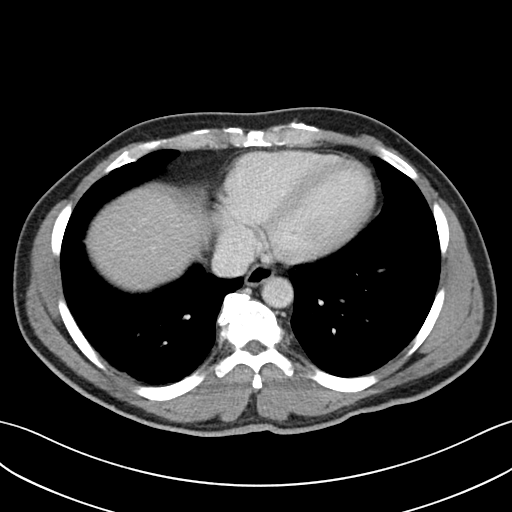
[im 90/100  lung]
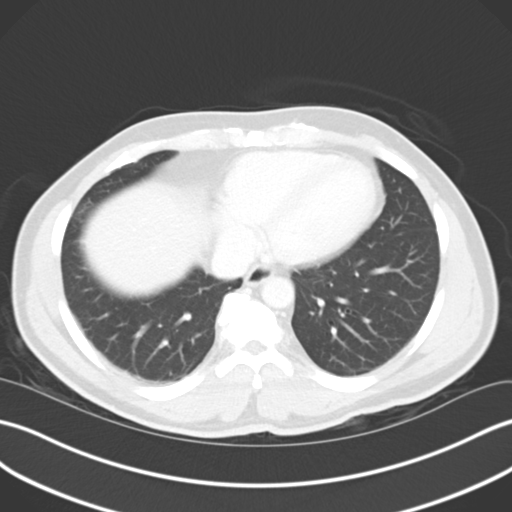
[im 90/100  bone]
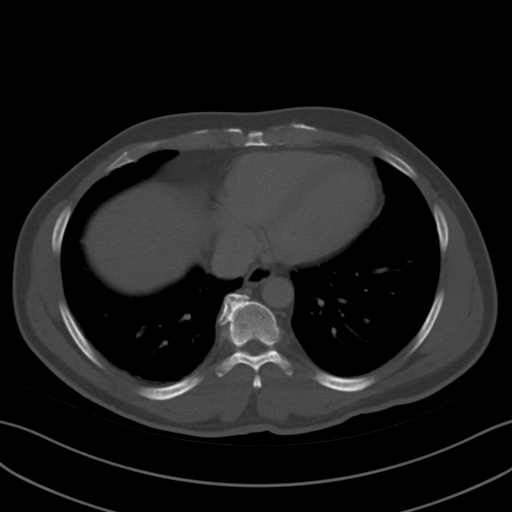

[11 of 46 positions shown; findings below may reference images not displayed]

FINDINGS: VASCULAR

Aorta: Normal caliber aorta without aneurysm, dissection, vasculitis
or significant stenosis.

Celiac: Patent without evidence of aneurysm, dissection, vasculitis
or significant stenosis.

SMA: Patent without evidence of aneurysm, dissection, vasculitis or
significant stenosis.

Renals: Both renal arteries are patent without evidence of aneurysm,
dissection, vasculitis, fibromuscular dysplasia or significant
stenosis.

IMA: Patent without evidence of aneurysm, dissection, vasculitis or
significant stenosis.

Inflow: Patent without evidence of aneurysm, dissection, vasculitis
or significant stenosis.

Proximal Outflow: Bilateral common femoral and visualized portions
of the superficial and profunda femoral arteries are patent without
evidence of aneurysm, dissection, vasculitis or significant
stenosis.

Veins: No central systemic or portal venous thrombosis identified.

Review of the MIP images confirms the above findings.

NON-VASCULAR

Lower chest: No acute abnormality.

Hepatobiliary: No focal liver abnormality is seen. No gallstones,
gallbladder wall thickening, or biliary dilatation.

Pancreas: Unremarkable. No pancreatic ductal dilatation or
surrounding inflammatory changes.

Spleen: 7 mm focus within right pole of spleen with enhancement
following blood pool compatible with hemangioma.

Adrenals/Urinary Tract: Adrenal glands are unremarkable. Kidneys are
normal, without renal calculi, focal lesion, or hydronephrosis.
Bladder is unremarkable.

Stomach/Bowel: Stomach is within normal limits. Appendix appears
normal. No evidence of bowel wall thickening, distention, or
inflammatory changes.

Lymphatic: No significant vascular findings are present. No enlarged
abdominal or pelvic lymph nodes.

Reproductive: Prostate is unremarkable.

Other: No abdominal wall hernia or abnormality. No abdominopelvic
ascites.

Musculoskeletal: No acute or significant osseous findings.
IMPRESSION: VASCULAR

Negative CTA of abdomen and pelvis. No evidence of aneurysm,
dissection, vasculitis, stenosis, or occlusion of the aorta or its
major branches. No central systemic or portal venous thrombosis
identified.

NON-VASCULAR

1. Subcentimeter hemangioma within the spleen.
2. Otherwise unremarkable CTA of the abdomen and pelvis.

By: Porchia Sole M.D.

## 2018-06-30 DIAGNOSIS — E1169 Type 2 diabetes mellitus with other specified complication: Secondary | ICD-10-CM | POA: Diagnosis not present

## 2018-06-30 DIAGNOSIS — E785 Hyperlipidemia, unspecified: Secondary | ICD-10-CM | POA: Diagnosis not present

## 2018-06-30 DIAGNOSIS — I10 Essential (primary) hypertension: Secondary | ICD-10-CM | POA: Diagnosis not present

## 2018-06-30 DIAGNOSIS — K760 Fatty (change of) liver, not elsewhere classified: Secondary | ICD-10-CM | POA: Diagnosis not present

## 2018-10-30 DIAGNOSIS — E1169 Type 2 diabetes mellitus with other specified complication: Secondary | ICD-10-CM | POA: Diagnosis not present

## 2018-10-30 DIAGNOSIS — E785 Hyperlipidemia, unspecified: Secondary | ICD-10-CM | POA: Diagnosis not present

## 2018-10-30 DIAGNOSIS — N529 Male erectile dysfunction, unspecified: Secondary | ICD-10-CM | POA: Diagnosis not present

## 2018-10-30 DIAGNOSIS — E782 Mixed hyperlipidemia: Secondary | ICD-10-CM | POA: Diagnosis not present

## 2018-10-30 DIAGNOSIS — Z23 Encounter for immunization: Secondary | ICD-10-CM | POA: Diagnosis not present

## 2018-10-30 DIAGNOSIS — I10 Essential (primary) hypertension: Secondary | ICD-10-CM | POA: Diagnosis not present

## 2019-03-16 DIAGNOSIS — I10 Essential (primary) hypertension: Secondary | ICD-10-CM | POA: Diagnosis not present

## 2019-03-16 DIAGNOSIS — E782 Mixed hyperlipidemia: Secondary | ICD-10-CM | POA: Diagnosis not present

## 2019-03-16 DIAGNOSIS — E1169 Type 2 diabetes mellitus with other specified complication: Secondary | ICD-10-CM | POA: Diagnosis not present

## 2019-03-16 DIAGNOSIS — K760 Fatty (change of) liver, not elsewhere classified: Secondary | ICD-10-CM | POA: Diagnosis not present

## 2019-08-01 DIAGNOSIS — I10 Essential (primary) hypertension: Secondary | ICD-10-CM | POA: Diagnosis not present

## 2019-08-01 DIAGNOSIS — E1169 Type 2 diabetes mellitus with other specified complication: Secondary | ICD-10-CM | POA: Diagnosis not present

## 2019-08-01 DIAGNOSIS — E782 Mixed hyperlipidemia: Secondary | ICD-10-CM | POA: Diagnosis not present

## 2019-08-01 DIAGNOSIS — K760 Fatty (change of) liver, not elsewhere classified: Secondary | ICD-10-CM | POA: Diagnosis not present

## 2019-08-03 DIAGNOSIS — I10 Essential (primary) hypertension: Secondary | ICD-10-CM | POA: Diagnosis not present

## 2019-08-03 DIAGNOSIS — E785 Hyperlipidemia, unspecified: Secondary | ICD-10-CM | POA: Diagnosis not present

## 2019-08-03 DIAGNOSIS — F064 Anxiety disorder due to known physiological condition: Secondary | ICD-10-CM | POA: Diagnosis not present

## 2019-08-03 DIAGNOSIS — E1169 Type 2 diabetes mellitus with other specified complication: Secondary | ICD-10-CM | POA: Diagnosis not present

## 2019-12-28 DIAGNOSIS — E781 Pure hyperglyceridemia: Secondary | ICD-10-CM | POA: Diagnosis not present

## 2019-12-28 DIAGNOSIS — E1169 Type 2 diabetes mellitus with other specified complication: Secondary | ICD-10-CM | POA: Diagnosis not present

## 2019-12-28 DIAGNOSIS — I10 Essential (primary) hypertension: Secondary | ICD-10-CM | POA: Diagnosis not present

## 2020-03-21 DIAGNOSIS — E1169 Type 2 diabetes mellitus with other specified complication: Secondary | ICD-10-CM | POA: Diagnosis not present

## 2020-03-21 DIAGNOSIS — E785 Hyperlipidemia, unspecified: Secondary | ICD-10-CM | POA: Diagnosis not present

## 2020-03-21 DIAGNOSIS — I10 Essential (primary) hypertension: Secondary | ICD-10-CM | POA: Diagnosis not present

## 2020-03-21 DIAGNOSIS — E782 Mixed hyperlipidemia: Secondary | ICD-10-CM | POA: Diagnosis not present

## 2020-03-21 DIAGNOSIS — Z125 Encounter for screening for malignant neoplasm of prostate: Secondary | ICD-10-CM | POA: Diagnosis not present

## 2020-07-23 DIAGNOSIS — I10 Essential (primary) hypertension: Secondary | ICD-10-CM | POA: Diagnosis not present

## 2020-07-23 DIAGNOSIS — E782 Mixed hyperlipidemia: Secondary | ICD-10-CM | POA: Diagnosis not present

## 2020-07-23 DIAGNOSIS — E1169 Type 2 diabetes mellitus with other specified complication: Secondary | ICD-10-CM | POA: Diagnosis not present

## 2020-08-06 DIAGNOSIS — I10 Essential (primary) hypertension: Secondary | ICD-10-CM | POA: Diagnosis not present

## 2020-08-06 DIAGNOSIS — E1169 Type 2 diabetes mellitus with other specified complication: Secondary | ICD-10-CM | POA: Diagnosis not present

## 2020-12-01 DIAGNOSIS — N529 Male erectile dysfunction, unspecified: Secondary | ICD-10-CM | POA: Diagnosis not present

## 2020-12-01 DIAGNOSIS — E785 Hyperlipidemia, unspecified: Secondary | ICD-10-CM | POA: Diagnosis not present

## 2020-12-01 DIAGNOSIS — E1169 Type 2 diabetes mellitus with other specified complication: Secondary | ICD-10-CM | POA: Diagnosis not present

## 2020-12-01 DIAGNOSIS — Z6828 Body mass index (BMI) 28.0-28.9, adult: Secondary | ICD-10-CM | POA: Diagnosis not present

## 2020-12-01 DIAGNOSIS — E782 Mixed hyperlipidemia: Secondary | ICD-10-CM | POA: Diagnosis not present

## 2020-12-01 DIAGNOSIS — I10 Essential (primary) hypertension: Secondary | ICD-10-CM | POA: Diagnosis not present

## 2020-12-01 DIAGNOSIS — Z23 Encounter for immunization: Secondary | ICD-10-CM | POA: Diagnosis not present

## 2023-04-10 ENCOUNTER — Other Ambulatory Visit (HOSPITAL_COMMUNITY): Payer: Self-pay

## 2023-04-10 MED ORDER — VITAMIN D3 1.25 MG (50000 UT) PO CAPS
50000.0000 [IU] | ORAL_CAPSULE | ORAL | 1 refills | Status: AC
  Filled 2023-04-10: qty 12, 84d supply, fill #0

## 2023-04-10 MED ORDER — MONTELUKAST SODIUM 10 MG PO TABS
10.0000 mg | ORAL_TABLET | Freq: Every day | ORAL | 0 refills | Status: AC
  Filled 2023-04-10: qty 90, 90d supply, fill #0

## 2023-04-20 ENCOUNTER — Other Ambulatory Visit (HOSPITAL_COMMUNITY): Payer: Self-pay
